# Patient Record
Sex: Female | Born: 1980 | Race: Black or African American | Hispanic: No | Marital: Single | Smoking: Former smoker
Health system: Southern US, Community
[De-identification: ages and names within clinical notes are randomized; demographics above are authoritative.]

## PROBLEM LIST (undated history)

## (undated) HISTORY — PX: WISDOM TOOTH EXTRACTION: SHX21

## (undated) HISTORY — PX: CYST EXCISION: SHX5701

## (undated) HISTORY — DX: Morbid (severe) obesity due to excess calories: E66.01

---

## 2005-10-08 ENCOUNTER — Emergency Department (HOSPITAL_COMMUNITY): Admission: EM | Admit: 2005-10-08 | Discharge: 2005-10-08 | Payer: Self-pay | Admitting: Emergency Medicine

## 2006-03-09 ENCOUNTER — Emergency Department (HOSPITAL_COMMUNITY): Admission: EM | Admit: 2006-03-09 | Discharge: 2006-03-09 | Payer: Self-pay | Admitting: Emergency Medicine

## 2007-05-05 ENCOUNTER — Emergency Department (HOSPITAL_COMMUNITY): Admission: EM | Admit: 2007-05-05 | Discharge: 2007-05-05 | Payer: Self-pay | Admitting: Emergency Medicine

## 2009-03-22 ENCOUNTER — Emergency Department (HOSPITAL_COMMUNITY): Admission: EM | Admit: 2009-03-22 | Discharge: 2009-03-23 | Payer: Self-pay | Admitting: Emergency Medicine

## 2009-04-13 ENCOUNTER — Emergency Department (HOSPITAL_COMMUNITY): Admission: EM | Admit: 2009-04-13 | Discharge: 2009-04-14 | Payer: Self-pay | Admitting: Emergency Medicine

## 2010-07-01 IMAGING — US US OB COMP LESS 14 WK
1 series · 14 of 22 positions shown · non-contrast
Comparison: None

CLINICAL DATA: None weeks gestation.  Vaginal bleeding.

OBSTETRIC <14 WK ULTRASOUND
TECHNIQUE: Transabdominal ultrasound was performed for evaluation
of the gestation as well as the maternal uterus and adnexal
regions.

[Series 1: us ob comp less 14 wk · 0.22mm/px · 14 of 22 slices shown]
[im 1/22]
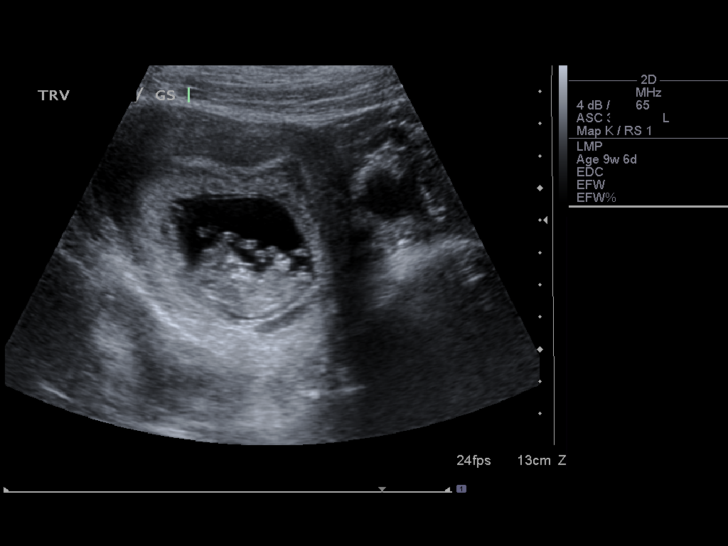
[im 3/22]
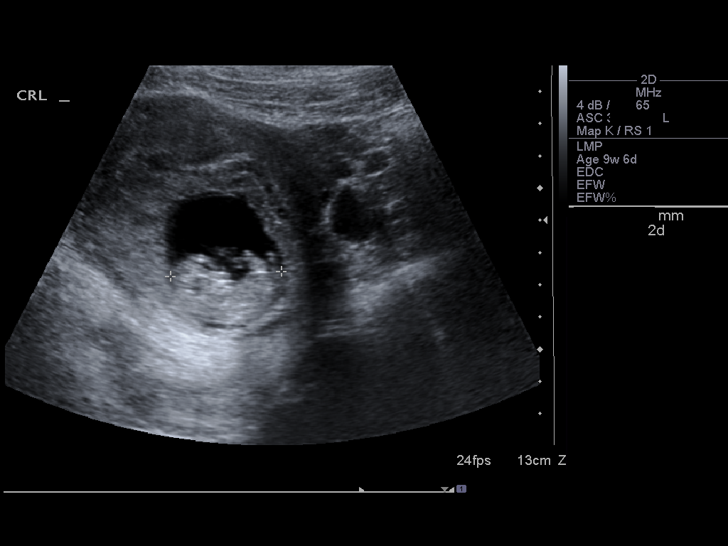
[im 4/22]
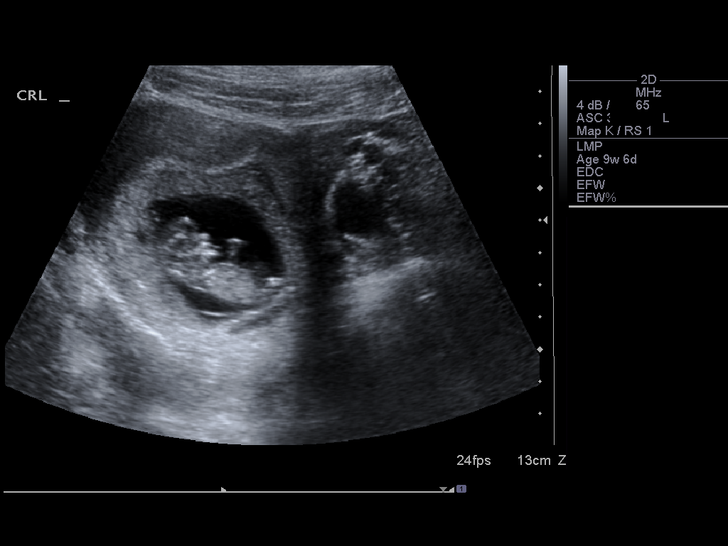
[im 6/22]
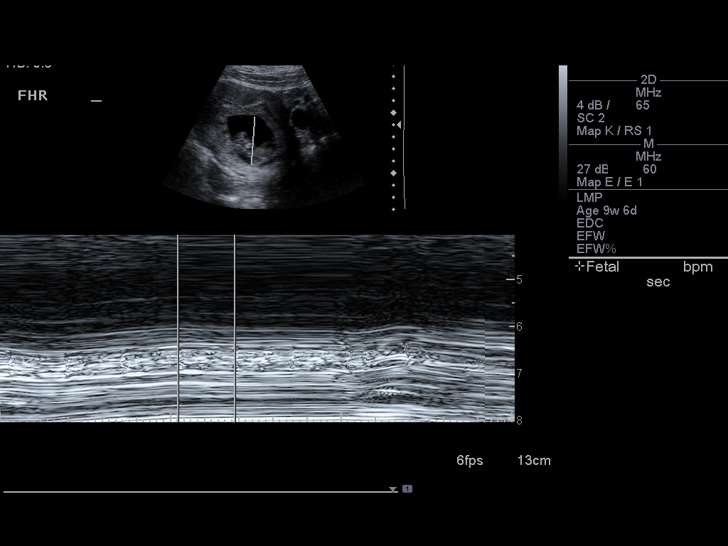
[im 8/22]
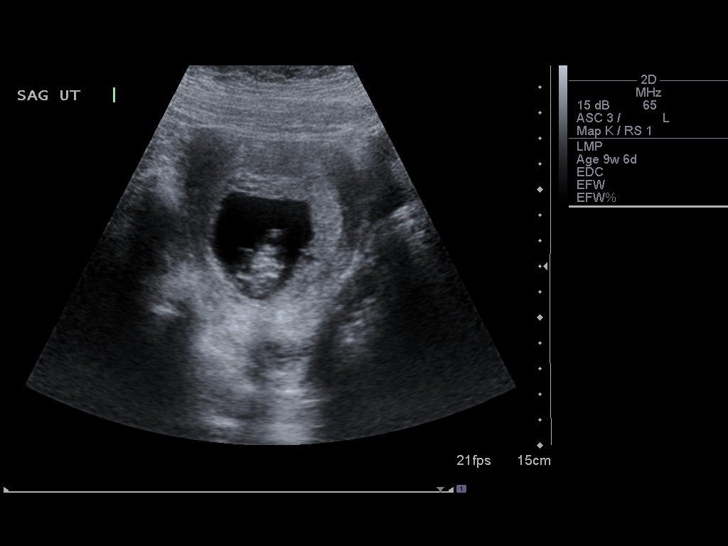
[im 9/22]
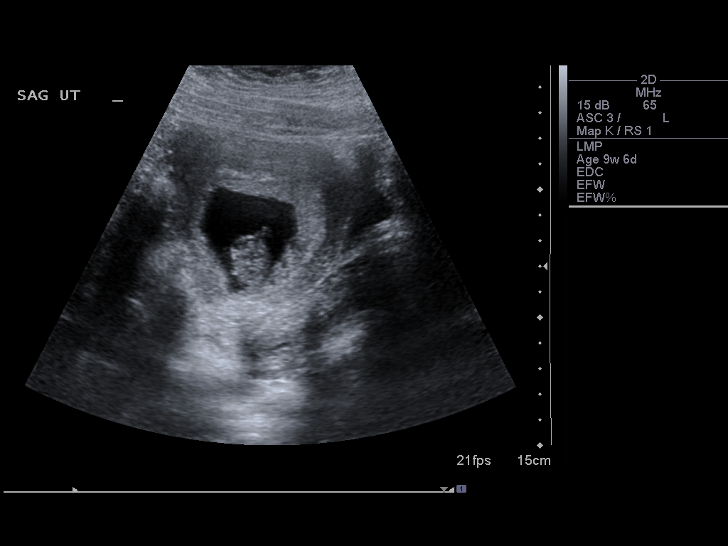
[im 11/22]
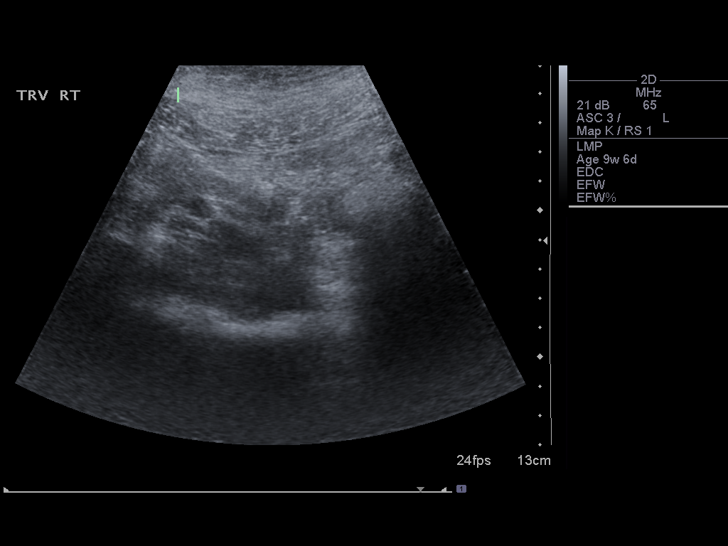
[im 12/22]
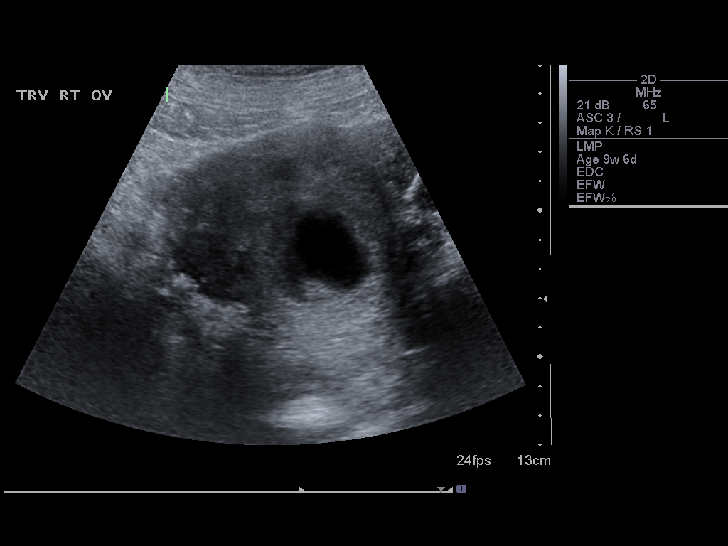
[im 14/22]
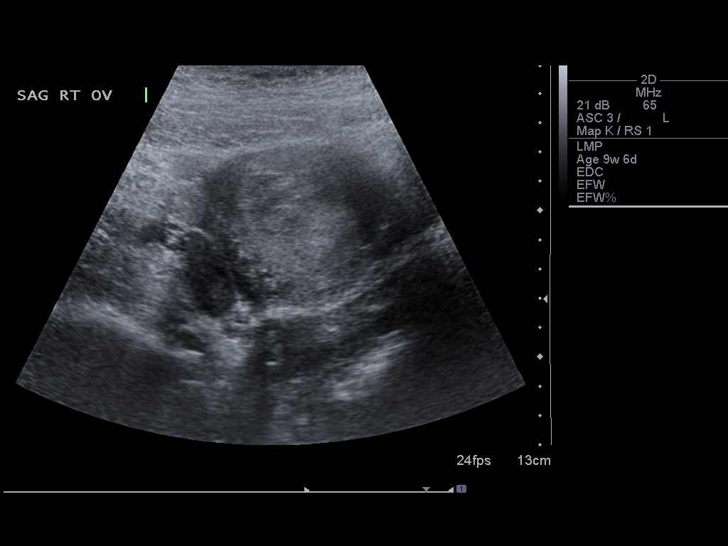
[im 15/22]
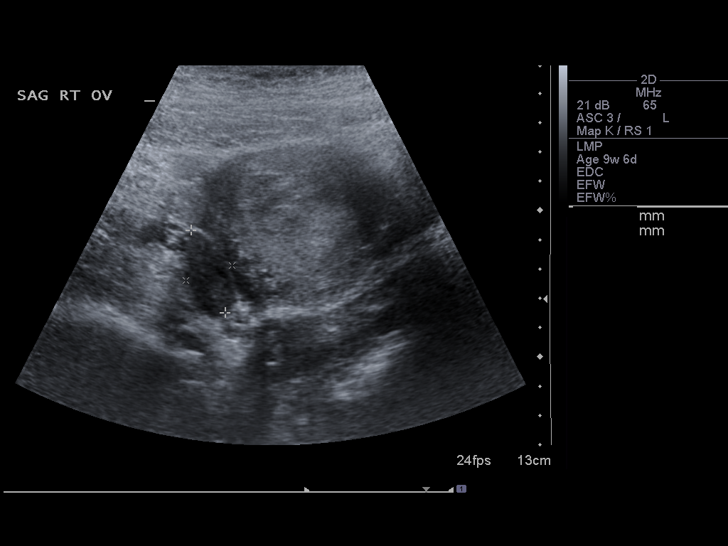
[im 17/22]
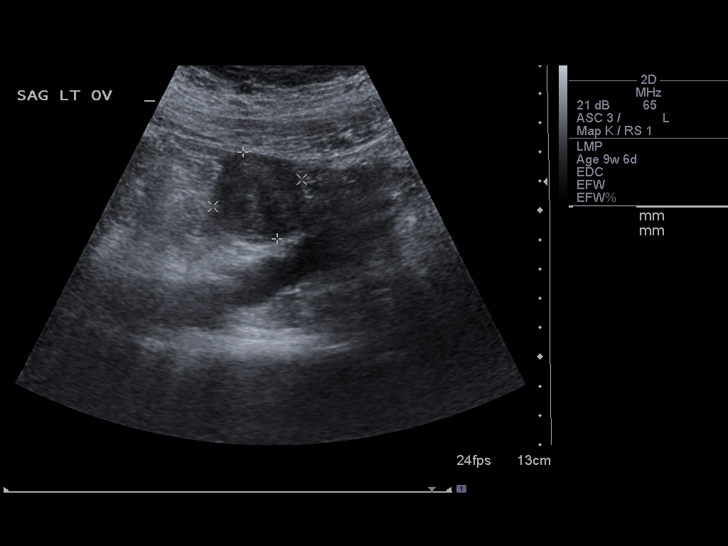
[im 19/22]
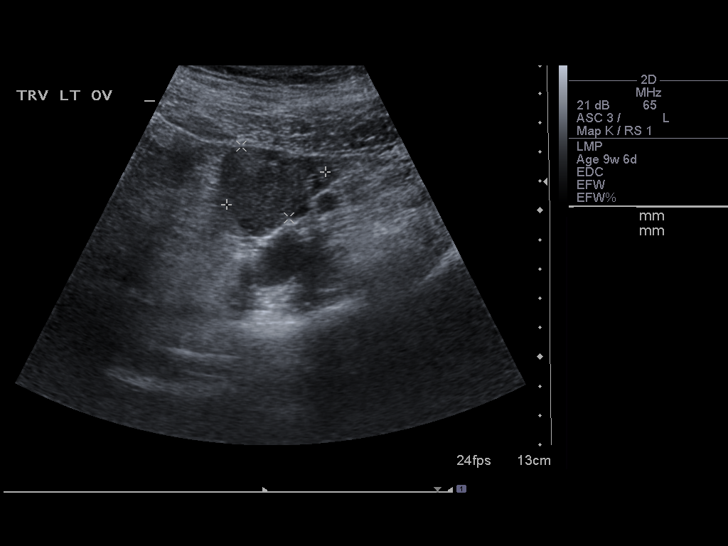
[im 20/22]
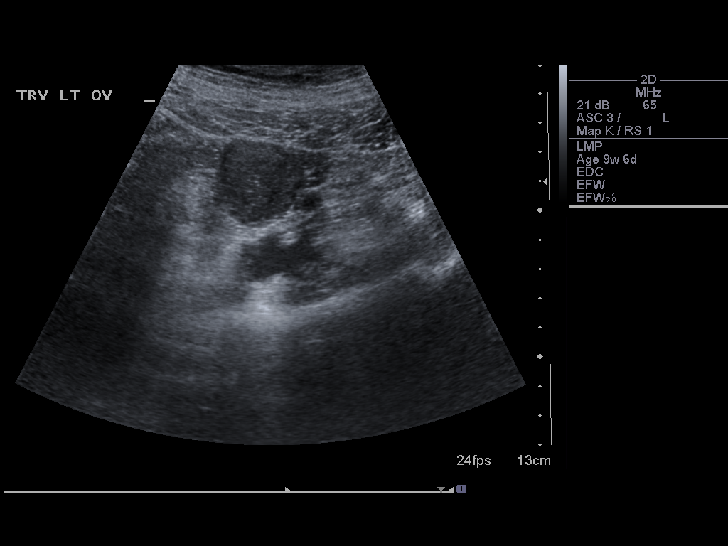
[im 22/22]
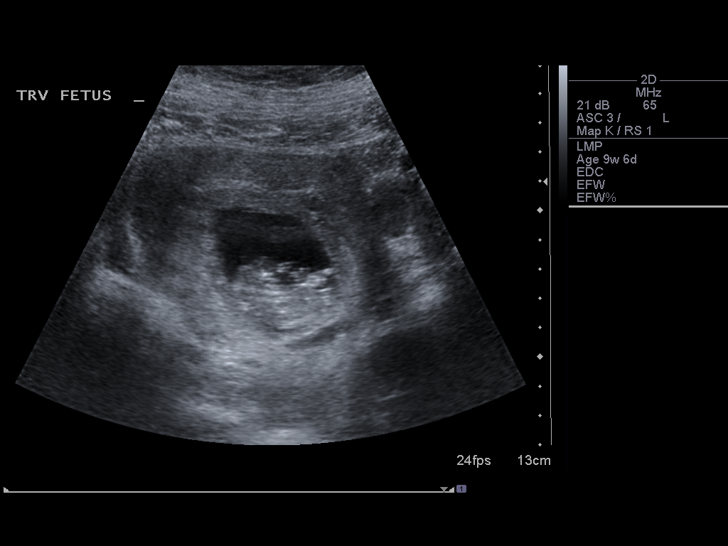

[14 of 22 positions shown; findings below may reference images not displayed]

Intrauterine gestational sac: Single
Yolk sac: Not visualized.
Embryo: Single live intrauterine gestation.
Cardiac Activity: Present
Heart Rate: 175 bpm

CRL:  35.5 mm         10 weeks 3 days           US EDC: 11/07/2009

Maternal uterus/Adnexae:
Incompletely fused amnion is noted.  There is no subchorionic
hematoma.  Both maternal ovaries are visualized and appear normal.
IMPRESSION: 1.  Single live intrauterine gestation with best estimated
gestational age of 10 weeks 3 days.
2.  No demonstrated complication.

## 2010-12-29 LAB — URINE MICROSCOPIC-ADD ON

## 2010-12-29 LAB — CBC
MCV: 90.2 fL (ref 78.0–100.0)
RBC: 4.07 MIL/uL (ref 3.87–5.11)
WBC: 13.4 10*3/uL — ABNORMAL HIGH (ref 4.0–10.5)

## 2010-12-29 LAB — DIFFERENTIAL
Lymphocytes Relative: 23 % (ref 12–46)
Lymphs Abs: 3 10*3/uL (ref 0.7–4.0)
Monocytes Relative: 7 % (ref 3–12)
Neutro Abs: 9.1 10*3/uL — ABNORMAL HIGH (ref 1.7–7.7)
Neutrophils Relative %: 68 % (ref 43–77)

## 2010-12-29 LAB — GC/CHLAMYDIA PROBE AMP, GENITAL
Chlamydia, DNA Probe: NEGATIVE
GC Probe Amp, Genital: NEGATIVE
GC Probe Amp, Genital: NEGATIVE

## 2010-12-29 LAB — URINALYSIS, ROUTINE W REFLEX MICROSCOPIC
Bilirubin Urine: NEGATIVE
Bilirubin Urine: NEGATIVE
Glucose, UA: NEGATIVE mg/dL
Glucose, UA: NEGATIVE mg/dL
Hgb urine dipstick: NEGATIVE
Protein, ur: NEGATIVE mg/dL
Specific Gravity, Urine: 1.029 (ref 1.005–1.030)
Urobilinogen, UA: 0.2 mg/dL (ref 0.0–1.0)
Urobilinogen, UA: 0.2 mg/dL (ref 0.0–1.0)

## 2010-12-29 LAB — POCT I-STAT, CHEM 8
BUN: 14 mg/dL (ref 6–23)
Chloride: 103 mEq/L (ref 96–112)
Creatinine, Ser: 0.8 mg/dL (ref 0.4–1.2)
Potassium: 4.2 mEq/L (ref 3.5–5.1)
Sodium: 137 mEq/L (ref 135–145)

## 2010-12-29 LAB — WET PREP, GENITAL
Clue Cells Wet Prep HPF POC: NONE SEEN
Trich, Wet Prep: NONE SEEN
Yeast Wet Prep HPF POC: NONE SEEN

## 2010-12-29 LAB — HCG, QUANTITATIVE, PREGNANCY
hCG, Beta Chain, Quant, S: 47608 m[IU]/mL — ABNORMAL HIGH (ref <5)
hCG, Beta Chain, Quant, S: 25910 m[IU]/mL — ABNORMAL HIGH (ref <5)

## 2010-12-29 LAB — ABO/RH: ABO/RH(D): O NEG

## 2010-12-29 LAB — POCT PREGNANCY, URINE: Preg Test, Ur: POSITIVE

## 2011-07-07 LAB — URINE MICROSCOPIC-ADD ON

## 2011-07-07 LAB — URINALYSIS, ROUTINE W REFLEX MICROSCOPIC
Hgb urine dipstick: NEGATIVE
Nitrite: NEGATIVE
Protein, ur: NEGATIVE
Specific Gravity, Urine: 1.023
Urobilinogen, UA: 0.2

## 2011-07-07 LAB — URINE CULTURE

## 2019-01-11 ENCOUNTER — Emergency Department (HOSPITAL_COMMUNITY)
Admission: EM | Admit: 2019-01-11 | Discharge: 2019-01-11 | Disposition: A | Discharge status: Home / Self Care | Payer: Medicaid - Out of State | Attending: Emergency Medicine | Admitting: Emergency Medicine

## 2019-01-11 ENCOUNTER — Other Ambulatory Visit: Payer: Self-pay

## 2019-01-11 ENCOUNTER — Encounter (HOSPITAL_COMMUNITY): Payer: Self-pay

## 2019-01-11 DIAGNOSIS — Z01419 Encounter for gynecological examination (general) (routine) without abnormal findings: Secondary | ICD-10-CM

## 2019-01-11 DIAGNOSIS — Y9389 Activity, other specified: Secondary | ICD-10-CM | POA: Insufficient documentation

## 2019-01-11 DIAGNOSIS — Y929 Unspecified place or not applicable: Secondary | ICD-10-CM | POA: Insufficient documentation

## 2019-01-11 DIAGNOSIS — T192XXA Foreign body in vulva and vagina, initial encounter: Secondary | ICD-10-CM | POA: Diagnosis present

## 2019-01-11 DIAGNOSIS — Z711 Person with feared health complaint in whom no diagnosis is made: Secondary | ICD-10-CM | POA: Insufficient documentation

## 2019-01-11 DIAGNOSIS — Y999 Unspecified external cause status: Secondary | ICD-10-CM | POA: Diagnosis not present

## 2019-01-11 DIAGNOSIS — X58XXXA Exposure to other specified factors, initial encounter: Secondary | ICD-10-CM | POA: Insufficient documentation

## 2019-01-11 NOTE — ED Provider Notes (Signed)
[facility] EMERGENCY DEPARTMENT Provider Note   CSN: [id number] Arrival date & time: 01/11/19  1929    History   Chief Complaint Chief Complaint  Patient presents with  . Foreign Body in Vagina    HPI Jacqueline Castillo is a 38 y.o. female.     Patient is a 38 year old female with no past medical history who presents the emergency department for complaints of thinking that she had a condom retained in her vagina.  Patient reports that she had sexual intercourse with her partner last night around 2 AM.  She reports that her partner thought that the condom fell off and was retained in her vagina.  The patient has no complaints at this time.  Denies any vaginal irritation, vaginal bleeding, vaginal discharge, vaginal odor, abdominal pain, dysuria.     History reviewed. No pertinent past medical history.  There are no active problems to display for this patient.   History reviewed. No pertinent surgical history.   OB History   No obstetric history on file.      Home Medications    Prior to Admission medications   Not on File    Family History No family history on file.  Social History Social History   Tobacco Use  . Smoking status: Not on file  Substance Use Topics  . Alcohol use: Not on file  . Drug use: Not on file     Allergies   Patient has no allergy information on record.   Review of Systems Review of Systems  Constitutional: Negative for chills and fever.  HENT: Negative for ear pain and sore throat.   Eyes: Negative for pain and visual disturbance.  Respiratory: Negative for cough and shortness of breath.   Cardiovascular: Negative for chest pain and palpitations.  Gastrointestinal: Negative for abdominal pain and vomiting.  Genitourinary: Negative.  Negative for dysuria and hematuria.  Musculoskeletal: Negative for arthralgias and back pain.  Skin: Negative for color change and rash.  Neurological: Negative for seizures and  syncope.  All other systems reviewed and are negative.    Physical Exam Updated Vital Signs BP 98/82 (BP Location: Right Arm)   Pulse 89   Temp 98.3 F (36.8 C) (Oral)   Resp 18   SpO2 99%   Physical Exam Vitals signs and nursing note reviewed.  Constitutional:      General: She is not in acute distress.    Appearance: She is well-developed.  HENT:     Head: Normocephalic and atraumatic.  Eyes:     Conjunctiva/sclera: Conjunctivae normal.  Neck:     Musculoskeletal: Neck supple.  Cardiovascular:     Rate and Rhythm: Normal rate and regular rhythm.     Heart sounds: No murmur.  Pulmonary:     Effort: Pulmonary effort is normal. No respiratory distress.     Breath sounds: Normal breath sounds.  Abdominal:     Palpations: Abdomen is soft.     Tenderness: There is no abdominal tenderness.  Genitourinary:    Comments: Normal vaginal exam.  There is no foreign body in the vagina. Skin:    General: Skin is warm and dry.  Neurological:     Mental Status: She is alert.      ED Treatments / Results  Labs (all labs ordered are listed, but only abnormal results are displayed) Labs Reviewed - No data to display  EKG None  Radiology No results found.  Procedures Procedures (including critical care time)  Medications  Ordered in ED Medications - No data to display   Initial Impression / Assessment and Plan / ED Course  I have reviewed the triage vital signs and the nursing notes.  Pertinent labs & imaging results that were available during my care of the patient were reviewed by me and considered in my medical decision making (see chart for details).  Clinical Course as of Jan 11 2051  Tue Jan 11, 2019  2051 Patient thought that she may have a retained condom in her vagina.  Vaginal exam was normal.  She declined any vaginal swabs or STD testing.   [KM]    Clinical Course User Index [KM] Arlyn Dunning, PA-C         Final Clinical Impressions(s) / ED  Diagnoses   Final diagnoses:  Normal vaginal exam    ED Discharge Orders    None       Jeral Pinch 01/11/19 2052    Little, Ambrose Finland, MD 01/16/19 1024

## 2019-01-11 NOTE — Discharge Instructions (Addendum)
Thank you for allowing me to care for you today. Please return to the emergency department if you have new or worsening symptoms. Take your medications as instructed.  ° °

## 2019-01-11 NOTE — ED Triage Notes (Signed)
Pt states she has a condom stuck in her vagina.

## 2019-12-20 ENCOUNTER — Other Ambulatory Visit: Payer: Self-pay

## 2019-12-20 ENCOUNTER — Encounter (HOSPITAL_COMMUNITY): Payer: Self-pay | Admitting: Emergency Medicine

## 2019-12-20 ENCOUNTER — Emergency Department (HOSPITAL_COMMUNITY)
Admission: EM | Admit: 2019-12-20 | Discharge: 2019-12-20 | Disposition: A | Discharge status: Home / Self Care | Payer: HRSA Program | Attending: Emergency Medicine | Admitting: Emergency Medicine

## 2019-12-20 DIAGNOSIS — R5383 Other fatigue: Secondary | ICD-10-CM | POA: Diagnosis not present

## 2019-12-20 DIAGNOSIS — U071 COVID-19: Secondary | ICD-10-CM | POA: Diagnosis not present

## 2019-12-20 DIAGNOSIS — Z20822 Contact with and (suspected) exposure to covid-19: Secondary | ICD-10-CM

## 2019-12-20 NOTE — Discharge Instructions (Addendum)
We have sent a send out PCR COVID test per your request. Please stay home and self isolate until you receive your results. Given you had a POSITIVE rapid test at your job today it is recommended that you self isolate for 10 days starting from today (cleared: 04/10). Even if your PCR test is negative you have a documented POSITIVE test at work and you will still need to self isolate per CDC guidelines.   Please continue monitoring your symptoms at home. You may take Tylenol as needed for fevers and body aches.   Please see attached resources on COVID 19.

## 2019-12-20 NOTE — ED Triage Notes (Signed)
Pt reports testing covid + today at work. 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 Denies any symptoms. Wants a recheck.

## 2019-12-20 NOTE — ED Provider Notes (Signed)
[facility] EMERGENCY DEPARTMENT Provider Note   CSN: [id number] Arrival date & time: 12/20/19  1752     History Chief Complaint  Patient presents with  . covid positive    Jacqueline Castillo is a 39 y.o. female who presents to the ED today after having a positive rapid Covid test done at work.  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  She states she was told she was positive today however wants a PCR test to confirm.  States that she started feeling ill today.  She states she feels fatigued. Denies fevers, chills, cough, SOB, diarrhea. Denies recent COVID 19 positive exposure.   The history is provided by the patient and medical records.       History reviewed. No pertinent past medical history.  There are no problems to display for this patient.   History reviewed. No pertinent surgical history.   OB History   No obstetric history on file.     No family history on file.  Social History   Tobacco Use  . Smoking status: Not on file  Substance Use Topics  . Alcohol use: Not on file  . Drug use: Not on file    Home Medications Prior to Admission medications   Not on File    Allergies    Patient has no allergy information on record.  Review of Systems   Review of Systems  Constitutional: Positive for fatigue. Negative for chills and fever.  Respiratory: Negative for cough.     Physical Exam Updated Vital Signs BP (!) 137/105   Pulse (!) 106   Temp 98.5 F (36.9 C) (Oral)   Resp 16   Ht 5\' 4"  (1.626 m)   Wt 108 kg   SpO2 100%   BMI 40.85 kg/m   Physical Exam Vitals and nursing note reviewed.  Constitutional:      Appearance: She is not ill-appearing.  HENT:     Head: Normocephalic and atraumatic.  Eyes:     Conjunctiva/sclera: Conjunctivae normal.  Cardiovascular:     Rate and Rhythm: Normal rate and regular rhythm.     Pulses: Normal pulses.  Pulmonary:     Effort: Pulmonary effort is normal.     Breath sounds:  Normal breath sounds. No wheezing, rhonchi or rales.  Skin:    General: Skin is warm and dry.     Coloration: Skin is not jaundiced.  Neurological:     Mental Status: She is alert.     ED Results / Procedures / Treatments   Labs (all labs ordered are listed, but only abnormal results are displayed) Labs Reviewed  SARS CORONAVIRUS 2 (TAT 6-24 HRS)    EKG None  Radiology No results found.  Procedures Procedures (including critical care time)  Medications Ordered in ED Medications - No data to display  ED Course  I have reviewed the triage vital signs and the nursing notes.  Pertinent labs & imaging results that were available during my care of the patient were reviewed by me and considered in my medical decision making (see chart for details).    MDM Rules/Calculators/A&P                      39 year old female requesting PCR Covid test today.  Had a rapid Covid test done at work.  She began feeling ill today.  On arrival to the ED patient is afebrile, tachycardic in the 110s, nontachypneic.  She appears to  be in no acute distress.  She wants a second opinion which is why she wants the PCR test.  I lengthy discussion with patient regarding the fact that if she had a rapid positive Covid results then we can treat this as positive.  She is still requesting PCR testing be done.  I have strongly encouraged patient to self isolate for the next 10 days with her positive result from work.  A work note has been obtained for her.  Advised to monitor symptoms at home.  I encouraged her that even if her PCR test for what ever reason is negative that she should still treated as a positive test from her rapid test at work.  Patient is in agreement with plan.  She will follow up with her PCP.   This note was prepared using Dragon voice recognition software and may include unintentional dictation errors due to the inherent limitations of voice recognition software.  Kentley Cedillo was  evaluated in Emergency Department on 12/20/2019 for the symptoms described in the history of present illness. She was evaluated in the context of the global COVID-19 pandemic, which necessitated consideration that the patient might be at risk for infection with the SARS-CoV-2 virus that causes COVID-19. Institutional protocols and algorithms that pertain to the evaluation of patients at risk for COVID-19 are in a state of rapid change based on information released by regulatory bodies including the CDC and federal and state organizations. These policies and algorithms were followed during the patient's care in the ED.  Final Clinical Impression(s) / ED Diagnoses Final diagnoses:  Person under investigation for COVID-19    Rx / DC Orders ED Discharge Orders    None       Discharge Instructions     We have sent a send out PCR COVID test per your request. Please stay home and self isolate until you receive your results. Given you had a POSITIVE rapid test at your job today it is recommended that you self isolate for 10 days starting from today (cleared: 04/10). Even if your PCR test is negative you have a documented POSITIVE test at work and you will still need to self isolate per CDC guidelines.   Please continue monitoring your symptoms at home. You may take Tylenol as needed for fevers and body aches.   Please see attached resources on COVID 19.        Eustaquio Maize, PA-C 12/20/19 2146    Noemi Chapel, MD 12/21/19 1201

## 2019-12-20 NOTE — ED Notes (Signed)
Pt verbalizes understanding of d/c instructions. Pt ambulatory at d/c with all belongings.   

## 2019-12-21 LAB — SARS CORONAVIRUS 2 (TAT 6-24 HRS): SARS Coronavirus 2: POSITIVE — AB

## 2019-12-22 ENCOUNTER — Encounter: Payer: Self-pay | Admitting: Physician Assistant

## 2019-12-22 ENCOUNTER — Telehealth: Payer: Self-pay | Admitting: Physician Assistant

## 2019-12-22 ENCOUNTER — Other Ambulatory Visit: Payer: Self-pay | Admitting: Physician Assistant

## 2019-12-22 ENCOUNTER — Ambulatory Visit (HOSPITAL_COMMUNITY)
Admission: RE | Admit: 2019-12-22 | Discharge: 2019-12-22 | Disposition: A | Discharge status: Home / Self Care | Payer: HRSA Program | Source: Ambulatory Visit | Attending: Pulmonary Disease | Admitting: Pulmonary Disease

## 2019-12-22 DIAGNOSIS — U071 COVID-19: Secondary | ICD-10-CM | POA: Diagnosis not present

## 2019-12-22 MED ORDER — FAMOTIDINE IN NACL 20-0.9 MG/50ML-% IV SOLN: 20.0000 mg | Freq: Once | INTRAVENOUS | Status: DC | PRN

## 2019-12-22 MED ORDER — SODIUM CHLORIDE 0.9 % IV SOLN
INTRAVENOUS | Status: DC | PRN
  Administered 2019-12-22: 250 mL via INTRAVENOUS

## 2019-12-22 MED ORDER — DIPHENHYDRAMINE HCL 50 MG/ML IJ SOLN: 50.0000 mg | Freq: Once | INTRAMUSCULAR | Status: DC | PRN

## 2019-12-22 MED ORDER — METHYLPREDNISOLONE SODIUM SUCC 125 MG IJ SOLR: 125.0000 mg | Freq: Once | INTRAMUSCULAR | Status: DC | PRN

## 2019-12-22 MED ORDER — ALBUTEROL SULFATE HFA 108 (90 BASE) MCG/ACT IN AERS: 2.0000 | INHALATION_SPRAY | Freq: Once | RESPIRATORY_TRACT | Status: DC | PRN

## 2019-12-22 MED ORDER — EPINEPHRINE 0.3 MG/0.3ML IJ SOAJ: 0.3000 mg | Freq: Once | INTRAMUSCULAR | Status: DC | PRN

## 2019-12-22 MED ORDER — SODIUM CHLORIDE 0.9 % IV SOLN
700.0000 mg | Freq: Once | INTRAVENOUS | Status: AC
  Administered 2019-12-22: 12:00:00 700 mg via INTRAVENOUS
  Filled 2019-12-22: qty 20

## 2019-12-22 NOTE — Discharge Instructions (Signed)

## 2019-12-22 NOTE — Progress Notes (Signed)
  Diagnosis: COVID-19  Physician:Dr. Wright   Procedure: Covid Infusion Clinic Med: bamlanivimab infusion - Provided patient with bamlanimivab fact sheet for patients, parents and caregivers prior to infusion.  Complications: No immediate complications noted.  Discharge: Discharged home   Jacqueline Isaacks  Castillo 12/22/2019  

## 2019-12-22 NOTE — Telephone Encounter (Signed)
Called to discuss with patient about Covid symptoms and the use of bamlanivimab or casirivimab/imdevimab, a monoclonal antibody infusion for those with mild to moderate Covid symptoms and at a high risk of hospitalization.  Pt is qualified for this infusion at the [facility] due to BMI>35   Message left to call back  Kathryn Thompson PA-C  [facility]    

## 2019-12-22 NOTE — Progress Notes (Signed)
  I connected by phone with Jacqueline Castillo on 12/22/2019 at 9:06 AM to discuss the potential use of an new treatment for mild to moderate COVID-19 viral infection in non-hospitalized patients.  This patient is a 39 y.o. female that meets the FDA criteria for Emergency Use Authorization of bamlanivimab or casirivimab\imdevimab.  Has a (+) direct SARS-CoV-2 viral test result  Has mild or moderate COVID-19   Is ? 41 years of age and weighs ? 40 kg  Is NOT hospitalized due to COVID-19  Is NOT requiring oxygen therapy or requiring an increase in baseline oxygen flow rate due to COVID-19  Is within 10 days of symptom onset  Has at least one of the high risk factor(s) for progression to severe COVID-19 and/or hospitalization as defined in EUA.  Specific high risk criteria : BMI >/= 35   I have spoken and communicated the following to the patient or parent/caregiver:  1. FDA has authorized the emergency use of bamlanivimab and casirivimab\imdevimab for the treatment of mild to moderate COVID-19 in adults and pediatric patients with positive results of direct SARS-CoV-2 viral testing who are 83 years of age and older weighing at least 40 kg, and who are at high risk for progressing to severe COVID-19 and/or hospitalization.  2. The significant known and potential risks and benefits of bamlanivimab and casirivimab\imdevimab, and the extent to which such potential risks and benefits are unknown.  3. Information on available alternative treatments and the risks and benefits of those alternatives, including clinical trials.  4. Patients treated with bamlanivimab and casirivimab\imdevimab should continue to self-isolate and use infection control measures (e.g., wear mask, isolate, social distance, avoid sharing personal items, clean and disinfect "high touch" surfaces, and frequent handwashing) according to CDC guidelines.   5. The patient or parent/caregiver has the option to accept or refuse  bamlanivimab or casirivimab\imdevimab .  After reviewing this information with the patient, The patient agreed to proceed with receiving the bamlanimivab infusion and will be provided a copy of the Fact sheet prior to receiving the infusion.   Sx onset 3/30. Set up for infusion today 4/1 @ 12:30pm. Directions given  Cline Crock 12/22/2019 9:06 AM

## 2019-12-22 NOTE — Progress Notes (Signed)
Called to discuss with patient about Covid symptoms and the use of bamlanivimab or casirivimab/imdevimab, a monoclonal antibody infusion for those with mild to moderate Covid symptoms and at a high risk of hospitalization.  Pt is qualified for this infusion at the [facility] due to BMI>35   Message left to call back  Jettie Mannor PA-C  [facility]    

## 2020-05-29 ENCOUNTER — Encounter: Payer: Self-pay | Admitting: Internal Medicine

## 2020-06-07 ENCOUNTER — Telehealth (INDEPENDENT_AMBULATORY_CARE_PROVIDER_SITE_OTHER): Payer: Self-pay | Admitting: Internal Medicine

## 2020-06-07 ENCOUNTER — Encounter: Payer: Self-pay | Admitting: Internal Medicine

## 2020-06-07 DIAGNOSIS — Z7689 Persons encountering health services in other specified circumstances: Secondary | ICD-10-CM

## 2020-06-07 NOTE — Progress Notes (Signed)
Virtual Visit via Telephone Note  I connected with Jacqueline Castillo, on 06/07/2020 at 2:03 PM by telephone due to the COVID-19 pandemic and verified that I am speaking with the correct person using two identifiers.   Consent: I discussed the limitations, risks, security and privacy concerns of performing an evaluation and management service by telephone and the availability of in person appointments. I also discussed with the patient that there may be a patient responsible charge related to this service. The patient expressed understanding and agreed to proceed.   Location of Patient: Home   Location of Provider: Clinic    Persons participating in Telemedicine visit: Ersa Delaney [facility] Dr. Earlene Plater    History of Present Illness: Patient has a visit to establish care. Needs a new PCP. Last visit was in 2019. Moved here from [place]--has been visiting and moving back and forth since 2005. She is a traveling CNA. No significant PMH. No Rxs. No surgical history. Already established with Ob-GYN in the area. UTD on PAP--just had one this month and was negative. Had her first mammogram this month and was normal. No concerns.    Past Medical History:  Diagnosis Date  . Morbid obesity (HCC)    No Known Allergies  No current outpatient medications on file prior to visit.   No current facility-administered medications on file prior to visit.    Observations/Objective: NAD. Speaking clearly.  Work of breathing normal.  Alert and oriented. Mood appropriate.   Assessment and Plan: 1. Encounter to establish care Reviewed patient's PMH, social history, surgical history, and medications.  Has received COVID vaccine.  Is overdue for annual exam, screening blood work, and health maintenance topics. Have asked patient to return for visit to address these items.    Follow Up Instructions: Annual exam    I discussed the assessment and treatment plan with the patient. The patient was  provided an opportunity to ask questions and all were answered. The patient agreed with the plan and demonstrated an understanding of the instructions.   The patient was advised to call back or seek an in-person evaluation if the symptoms worsen or if the condition fails to improve as anticipated.     I provided 10 minutes total of non-face-to-face time during this encounter including median intraservice time, reviewing previous notes, investigations, ordering medications, medical decision making, coordinating care and patient verbalized understanding at the end of the visit.    Marcy Siren, D.O. Primary Care at [facility]  06/07/2020, 2:03 PM

## 2020-06-29 NOTE — Patient Instructions (Signed)

## 2020-07-02 ENCOUNTER — Ambulatory Visit (INDEPENDENT_AMBULATORY_CARE_PROVIDER_SITE_OTHER): Payer: 59 | Admitting: Internal Medicine

## 2020-07-02 ENCOUNTER — Other Ambulatory Visit: Payer: Self-pay

## 2020-07-02 ENCOUNTER — Encounter: Payer: Self-pay | Admitting: Internal Medicine

## 2020-07-02 ENCOUNTER — Other Ambulatory Visit (HOSPITAL_COMMUNITY)
Admission: RE | Admit: 2020-07-02 | Discharge: 2020-07-02 | Disposition: A | Discharge status: Home / Self Care | Payer: 59 | Source: Ambulatory Visit | Attending: Internal Medicine | Admitting: Internal Medicine

## 2020-07-02 VITALS — BP 108/60 | HR 77 | Temp 97.2°F | Resp 17 | Ht 67.0 in | Wt 219.0 lb

## 2020-07-02 DIAGNOSIS — Z1159 Encounter for screening for other viral diseases: Secondary | ICD-10-CM

## 2020-07-02 DIAGNOSIS — Z0000 Encounter for general adult medical examination without abnormal findings: Secondary | ICD-10-CM

## 2020-07-02 DIAGNOSIS — Z23 Encounter for immunization: Secondary | ICD-10-CM

## 2020-07-02 DIAGNOSIS — Z113 Encounter for screening for infections with a predominantly sexual mode of transmission: Secondary | ICD-10-CM | POA: Insufficient documentation

## 2020-07-02 DIAGNOSIS — Z1231 Encounter for screening mammogram for malignant neoplasm of breast: Secondary | ICD-10-CM

## 2020-07-02 NOTE — Progress Notes (Signed)
Subjective:    Jacqueline Castillo - 39 y.o. female MRN [id number]  Date of birth: Jan 10, 1981  HPI  Jacqueline Castillo is here for annual exam. Has no concerns today. Needs paperwork filled out for job for insurance policy. [place] [employer] [place] She is very proud of herself for losing 81 lbs. Has struggled with weight loss but feels like she is on track currently.      Health Maintenance:  Health Maintenance Due  Topic Date Due   Hepatitis C Screening  Never done   HIV Screening  Never done   INFLUENZA VACCINE  04/22/2020    -  reports that she has quit smoking. Her smoking use included cigarettes. She has never used smokeless tobacco. - Review of Systems: Per HPI. - Past Medical History: Patient Active Problem List   Diagnosis Date Noted   Morbid obesity (HCC)    - Medications: reviewed and updated   Objective:   Physical Exam BP 108/60    Pulse 77    Temp (!) 97.2 F (36.2 C) (Temporal)    Resp 17    Ht 5\' 7"  (1.702 m)    Wt 219 lb (99.3 kg)    SpO2 97%    BMI 34.30 kg/m  Physical Exam Constitutional:      Appearance: She is not diaphoretic.  HENT:     Head: Normocephalic and atraumatic.  Eyes:     Conjunctiva/sclera: Conjunctivae normal.     Pupils: Pupils are equal, round, and reactive to light.  Neck:     Thyroid: No thyromegaly.  Cardiovascular:     Rate and Rhythm: Normal rate and regular rhythm.     Heart sounds: Normal heart sounds. No murmur heard.   Pulmonary:     Effort: Pulmonary effort is normal. No respiratory distress.     Breath sounds: Normal breath sounds. No wheezing.  Abdominal:     General: Bowel sounds are normal. There is no distension.     Palpations: Abdomen is soft.     Tenderness: There is no abdominal tenderness. There is no guarding or rebound.  Musculoskeletal:        General: No deformity. Normal range of motion.     Cervical back: Normal range of motion and neck supple.  Lymphadenopathy:     Cervical:  No cervical adenopathy.  Skin:    General: Skin is warm and dry.     Findings: No rash.  Neurological:     Mental Status: She is alert and oriented to person, place, and time.     Gait: Gait is intact.  Psychiatric:        Mood and Affect: Mood and affect normal.        Judgment: Judgment normal.            Assessment & Plan:   1. Annual physical exam Counseled on 150 minutes of exercise per week, healthy eating (including decreased daily intake of saturated fats, cholesterol, added sugars, sodium), STI prevention, routine healthcare maintenance. - CBC with Differential - Comprehensive metabolic panel - Lipid Panel  2. Screening for STDs (sexually transmitted diseases) - Cervicovaginal ancillary only - RPR - HIV antibody (with reflex)  3. Breast cancer screening by mammogram - MM Digital Screening; Future  4. Need for hepatitis C screening test - HCV Ab w/Rflx to Verification  5. Needs flu shot - Flu Vaccine QUAD 6+ mos PF IM (Fluarix Quad PF)   , D.O. 07/02/2020, 9:50 AM Primary  Care at [facility]

## 2020-07-03 LAB — CERVICOVAGINAL ANCILLARY ONLY
Bacterial Vaginitis (gardnerella): NEGATIVE
Candida Glabrata: NEGATIVE
Candida Vaginitis: NEGATIVE
Chlamydia: NEGATIVE
Comment: NEGATIVE
Comment: NEGATIVE
Comment: NEGATIVE
Comment: NEGATIVE
Comment: NEGATIVE
Comment: NORMAL
Neisseria Gonorrhea: NEGATIVE
Trichomonas: NEGATIVE

## 2020-07-03 LAB — HCV AB W/RFLX TO VERIFICATION: HCV Ab: <0.1 s/co ratio (ref 0.0–0.9)

## 2020-07-03 LAB — CBC WITH DIFFERENTIAL/PLATELET
Basophils Absolute: 0 10*3/uL (ref 0.0–0.2)
Basos: 1 %
EOS (ABSOLUTE): 0.2 10*3/uL (ref 0.0–0.4)
Eos: 2 %
Hematocrit: 40.3 % (ref 34.0–46.6)
Hemoglobin: 13.4 g/dL (ref 11.1–15.9)
Immature Grans (Abs): 0 10*3/uL (ref 0.0–0.1)
Immature Granulocytes: 0 %
Lymphocytes Absolute: 2.6 10*3/uL (ref 0.7–3.1)
Lymphs: 41 %
MCH: 29.9 pg (ref 26.6–33.0)
MCHC: 33.3 g/dL (ref 31.5–35.7)
MCV: 90 fL (ref 79–97)
Monocytes Absolute: 0.5 10*3/uL (ref 0.1–0.9)
Monocytes: 8 %
Neutrophils Absolute: 3 10*3/uL (ref 1.4–7.0)
Neutrophils: 48 %
Platelets: 336 10*3/uL (ref 150–450)
RBC: 4.48 x10E6/uL (ref 3.77–5.28)
RDW: 12.3 % (ref 11.7–15.4)
WBC: 6.3 10*3/uL (ref 3.4–10.8)

## 2020-07-03 LAB — LIPID PANEL
Chol/HDL Ratio: 3 ratio (ref 0.0–4.4)
Cholesterol, Total: 169 mg/dL (ref 100–199)
HDL: 57 mg/dL (ref >39)
LDL Chol Calc (NIH): 97 mg/dL (ref 0–99)
Triglycerides: 79 mg/dL (ref 0–149)
VLDL Cholesterol Cal: 15 mg/dL (ref 5–40)

## 2020-07-03 LAB — COMPREHENSIVE METABOLIC PANEL
ALT: 13 IU/L (ref 0–32)
AST: 17 IU/L (ref 0–40)
Albumin/Globulin Ratio: 1.4 (ref 1.2–2.2)
Albumin: 4.2 g/dL (ref 3.8–4.8)
Alkaline Phosphatase: 76 IU/L (ref 44–121)
BUN/Creatinine Ratio: 16 (ref 9–23)
BUN: 11 mg/dL (ref 6–24)
Bilirubin Total: 0.3 mg/dL (ref 0.0–1.2)
CO2: 26 mmol/L (ref 20–29)
Calcium: 9.1 mg/dL (ref 8.7–10.2)
Chloride: 100 mmol/L (ref 96–106)
Creatinine, Ser: 0.7 mg/dL (ref 0.57–1.00)
GFR calc Af Amer: 124 mL/min/{1.73_m2} (ref >59)
GFR calc non Af Amer: 108 mL/min/{1.73_m2} (ref >59)
Globulin, Total: 2.9 g/dL (ref 1.5–4.5)
Glucose: 96 mg/dL (ref 65–99)
Potassium: 4.3 mmol/L (ref 3.5–5.2)
Sodium: 135 mmol/L (ref 134–144)
Total Protein: 7.1 g/dL (ref 6.0–8.5)

## 2020-07-03 LAB — HIV ANTIBODY (ROUTINE TESTING W REFLEX): HIV Screen 4th Generation wRfx: NONREACTIVE

## 2020-07-03 LAB — RPR: RPR Ser Ql: NONREACTIVE

## 2020-07-03 LAB — HCV INTERPRETATION

## 2020-09-18 ENCOUNTER — Ambulatory Visit: Payer: 59

## 2020-09-25 ENCOUNTER — Ambulatory Visit (INDEPENDENT_AMBULATORY_CARE_PROVIDER_SITE_OTHER): Payer: 59

## 2020-09-25 ENCOUNTER — Other Ambulatory Visit: Payer: Self-pay

## 2020-09-25 DIAGNOSIS — Z111 Encounter for screening for respiratory tuberculosis: Secondary | ICD-10-CM

## 2020-09-25 NOTE — Progress Notes (Signed)
Tuberculin skin test applied to right ventral forearm. Explained to patient to return within 48-72 hours for reading and paperwork. Patient verbalized understanding.  

## 2020-09-27 ENCOUNTER — Other Ambulatory Visit: Payer: Self-pay

## 2020-09-27 ENCOUNTER — Ambulatory Visit: Payer: Medicaid Other

## 2020-09-27 DIAGNOSIS — Z111 Encounter for screening for respiratory tuberculosis: Secondary | ICD-10-CM

## 2020-09-27 LAB — TB SKIN TEST
Induration: 0 mm
TB Skin Test: NEGATIVE

## 2020-09-27 NOTE — Progress Notes (Signed)
PPD read today @ 1:35pm, result was negative, pt given letter with result.

## 2020-09-27 NOTE — Progress Notes (Signed)
PPD reading complete, results negative, pt given letter w/ results

## 2020-10-22 ENCOUNTER — Telehealth (INDEPENDENT_AMBULATORY_CARE_PROVIDER_SITE_OTHER): Payer: 59 | Admitting: Physician Assistant

## 2020-10-22 ENCOUNTER — Encounter: Payer: Self-pay | Admitting: Emergency Medicine

## 2020-10-22 ENCOUNTER — Other Ambulatory Visit: Payer: Self-pay

## 2020-10-22 ENCOUNTER — Ambulatory Visit
Admission: EM | Admit: 2020-10-22 | Discharge: 2020-10-22 | Disposition: A | Discharge status: Home / Self Care | Payer: 59 | Attending: Emergency Medicine | Admitting: Emergency Medicine

## 2020-10-22 ENCOUNTER — Emergency Department (HOSPITAL_COMMUNITY)
Admission: EM | Admit: 2020-10-22 | Discharge: 2020-10-22 | Disposition: A | Discharge status: Home / Self Care | Payer: 59 | Attending: Emergency Medicine | Admitting: Emergency Medicine

## 2020-10-22 DIAGNOSIS — J00 Acute nasopharyngitis [common cold]: Secondary | ICD-10-CM | POA: Diagnosis not present

## 2020-10-22 DIAGNOSIS — Z5321 Procedure and treatment not carried out due to patient leaving prior to being seen by health care provider: Secondary | ICD-10-CM | POA: Diagnosis not present

## 2020-10-22 DIAGNOSIS — Z20822 Contact with and (suspected) exposure to covid-19: Secondary | ICD-10-CM

## 2020-10-22 DIAGNOSIS — R0981 Nasal congestion: Secondary | ICD-10-CM | POA: Insufficient documentation

## 2020-10-22 NOTE — Progress Notes (Signed)
Established Patient Office Visit  Subjective:  Patient ID: Jacqueline Castillo, female    DOB: Aug 24, 1981  Age: 40 y.o. MRN: [id number]  CC:  Chief Complaint  Patient presents with  . URI   Virtual Visit via Telephone Note  I connected with Jacqueline Castillo on 10/22/20 at  3:30 PM EST by telephone and verified that I am speaking with the correct person using two identifiers.  Location: Patient: Home Provider: Primary Care at [facility]   I discussed the limitations, risks, security and privacy concerns of performing an evaluation and management service by telephone and the availability of in person appointments. I also discussed with the patient that there may be a patient responsible charge related to this service. The patient expressed understanding and agreed to proceed.   History of Present Illness: Reports that she started experiencing a runny nose with clear drainage and a headache and chills yesterday.  Reports that she went to the emergency department at [facility] today, but left before being seen.  Reports that they did check her vitals, she did not have a fever.  Reports that she was tested for Covid but may not have the results for up to 4 days.  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  Has not tried anything for relief.   Reports two Moderna COVID vaccines with the last one in June 2021    Observations/Objective:  Medical history and current medications reviewed, no physical exam completed   Past Medical History:  Diagnosis Date  . Morbid obesity (HCC)     Past Surgical History:  Procedure Laterality Date  . CYST EXCISION     tailbone  . WISDOM TOOTH EXTRACTION      Family History  Problem Relation Age of Onset  . Huntington's disease Father     Social History   Socioeconomic History  . Marital status: Single    Spouse name: Not on file  . Number of children: Not on file  . Years of  education: Not on file  . Highest education level: Not on file  Occupational History  . Not on file  Tobacco Use  . Smoking status: Former Smoker    Types: Cigarettes  . Smokeless tobacco: Never Used  Substance and Sexual Activity  . Alcohol use: Not on file  . Drug use: Not on file  . Sexual activity: Not on file  Other Topics Concern  . Not on file  Social History Narrative  . Not on file   Social Determinants of Health   Financial Resource Strain: Not on file  Food Insecurity: Not on file  Transportation Needs: Not on file  Physical Activity: Not on file  Stress: Not on file  Social Connections: Not on file  Intimate Partner Violence: Not on file    No outpatient medications prior to visit.   No facility-administered medications prior to visit.    No Known Allergies  ROS Review of Systems  Constitutional: Positive for chills. Negative for fever.  HENT: Positive for rhinorrhea. Negative for congestion, sore throat and trouble swallowing.   Eyes: Negative.   Respiratory: Negative for cough and shortness of breath.   Cardiovascular: Negative.   Gastrointestinal: Negative for diarrhea, nausea and vomiting.  Endocrine: Negative.   Genitourinary: Negative.   Musculoskeletal: Negative for myalgias.  Skin: Negative.   Allergic/Immunologic: Negative.   Neurological: Positive for headaches.  Hematological: Negative.   Psychiatric/Behavioral: Negative.  Objective:     There were no vitals taken for this visit. Wt Readings from Last 3 Encounters:  07/02/20 219 lb (99.3 kg)  12/20/19 238 lb (108 kg)     Health Maintenance Due  Topic Date Due  . COVID-19 Vaccine (3 - Booster for Moderna series) 09/11/2020  . TETANUS/TDAP  09/22/2020    There are no preventive care reminders to display for this patient.  No results found for: TSH Lab Results  Component Value Date   WBC 6.3 07/02/2020   HGB 13.4 07/02/2020   HCT 40.3 07/02/2020   MCV 90 07/02/2020    PLT 336 07/02/2020   Lab Results  Component Value Date   NA 135 07/02/2020   K 4.3 07/02/2020   CO2 26 07/02/2020   GLUCOSE 96 07/02/2020   BUN 11 07/02/2020   CREATININE 0.70 07/02/2020   BILITOT 0.3 07/02/2020   ALKPHOS 76 07/02/2020   AST 17 07/02/2020   ALT 13 07/02/2020   PROT 7.1 07/02/2020   ALBUMIN 4.2 07/02/2020   CALCIUM 9.1 07/02/2020   Lab Results  Component Value Date   CHOL 169 07/02/2020   Lab Results  Component Value Date   HDL 57 07/02/2020   Lab Results  Component Value Date   LDLCALC 97 07/02/2020   Lab Results  Component Value Date   TRIG 79 07/02/2020   Lab Results  Component Value Date   CHOLHDL 3.0 07/02/2020   No results found for: HGBA1C    Assessment & Plan:   Problem List Items Addressed This Visit   None   Visit Diagnoses    Acute rhinitis    -  Primary     1. Acute rhinitis Patient was offered opportunity to present to Primary Care at [facility] in the morning for rapid Covid test.  Patient encouraged to use over-the-counter symptomatic treatment, follow her work protocol if she chooses not to have rapid testing completed.  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  Red flags given for prompt reevaluation  No orders of the defined types were placed in this encounter.   I have reviewed the patient's medical history (PMH, PSH, Social History, Family History, Medications, and allergies) , and have been updated if relevant. I spent 21 minutes reviewing chart and  Non face to face time with patient.     Follow-up: Return if symptoms worsen or fail to improve.    Kasandra Knudsen Mayers, PA-C

## 2020-10-22 NOTE — ED Notes (Signed)
Pt not answering

## 2020-10-22 NOTE — ED Notes (Signed)
Called patient to move to room, unable to locate at this time. 

## 2020-10-22 NOTE — ED Triage Notes (Signed)
Pt from home for eval of nasal congestion for a few days.

## 2020-10-22 NOTE — Progress Notes (Unsigned)
Patient verified DOB Patient complains of runny nose with clear drainage. Patient denies sore throat. Patient denies HA, patient complains of body aches. Patient N/V/ diarrhea. Patient denies fevers at home. Patient reports no exposure. Patients taste and smell is present. Patient has a normal appetite. Patient has not taken medication today.

## 2020-10-22 NOTE — ED Triage Notes (Signed)
Pt here for covid testing for work; denies sx

## 2020-10-23 LAB — NOVEL CORONAVIRUS, NAA: SARS-CoV-2, NAA: NOT DETECTED

## 2020-10-23 LAB — SARS-COV-2, NAA 2 DAY TAT

## 2020-10-24 ENCOUNTER — Encounter: Payer: Self-pay | Admitting: Physician Assistant

## 2020-10-24 NOTE — Patient Instructions (Signed)
Upper Respiratory Infection, Adult An upper respiratory infection (URI) is a common viral infection of the nose, throat, and upper air passages that lead to the lungs. The most common type of URI is the common cold. URIs usually get better on their own, without medical treatment. What are the causes? A URI is caused by a virus. You may catch a virus by:  Breathing in droplets from an infected person's cough or sneeze.  Touching something that has been exposed to the virus (contaminated) and then touching your mouth, nose, or eyes. What increases the risk? You are more likely to get a URI if:  You are very young or very old.  It is autumn or winter.  You have close contact with others, such as at a daycare, school, or health care facility.  You smoke.  You have long-term (chronic) heart or lung disease.  You have a weakened disease-fighting (immune) system.  You have nasal allergies or asthma.  You are experiencing a lot of stress.  You work in an area that has poor air circulation.  You have poor nutrition. What are the signs or symptoms? A URI usually involves some of the following symptoms:  Runny or stuffy (congested) nose.  Sneezing.  Cough.  Sore throat.  Headache.  Fatigue.  Fever.  Loss of appetite.  Pain in your forehead, behind your eyes, and over your cheekbones (sinus pain).  Muscle aches.  Redness or irritation of the eyes.  Pressure in the ears or face. How is this diagnosed? This condition may be diagnosed based on your medical history and symptoms, and a physical exam. Your health care provider may use a cotton swab to take a mucus sample from your nose (nasal swab). This sample can be tested to determine what virus is causing the illness. How is this treated? URIs usually get better on their own within 7-10 days. You can take steps at home to relieve your symptoms. Medicines cannot cure URIs, but your health care provider may recommend  certain medicines to help relieve symptoms, such as:  Over-the-counter cold medicines.  Cough suppressants. Coughing is a type of defense against infection that helps to clear the respiratory system, so take these medicines only as recommended by your health care provider.  Fever-reducing medicines. Follow these instructions at home: Activity  Rest as needed.  If you have a fever, stay home from work or school until your fever is gone or until your health care provider says you are no longer contagious. Your health care provider may have you wear a face mask to prevent your infection from spreading. Relieving symptoms  Gargle with a salt-water mixture 3-4 times a day or as needed. To make a salt-water mixture, completely dissolve -1 tsp of salt in 1 cup of warm water.  Use a cool-mist humidifier to add moisture to the air. This can help you breathe more easily. Eating and drinking  Drink enough fluid to keep your urine pale yellow.  Eat soups and other clear broths.   General instructions  Take over-the-counter and prescription medicines only as told by your health care provider. These include cold medicines, fever reducers, and cough suppressants.  Do not use any products that contain nicotine or tobacco, such as cigarettes and e-cigarettes. If you need help quitting, ask your health care provider.  Stay away from secondhand smoke.  Stay up to date on all immunizations, including the yearly (annual) flu vaccine.  Keep all follow-up visits as told by your health   care provider. This is important.   How to prevent the spread of infection to others  URIs can be passed from person to person (are contagious). To prevent the infection from spreading: ? Wash your hands often with soap and water. If soap and water are not available, use hand sanitizer. ? Avoid touching your mouth, face, eyes, or nose. ? Cough or sneeze into a tissue or your sleeve or elbow instead of into your hand or  into the air.   Contact a health care provider if:  You are getting worse instead of better.  You have a fever or chills.  Your mucus is brown or red.  You have yellow or brown discharge coming from your nose.  You have pain in your face, especially when you bend forward.  You have swollen neck glands.  You have pain while swallowing.  You have white areas in the back of your throat. Get help right away if:  You have shortness of breath that gets worse.  You have severe or persistent: ? Headache. ? Ear pain. ? Sinus pain. ? Chest pain.  You have chronic lung disease along with any of the following: ? Wheezing. ? Prolonged cough. ? Coughing up blood. ? A change in your usual mucus.  You have a stiff neck.  You have changes in your: ? Vision. ? Hearing. ? Thinking. ? Mood. Summary  An upper respiratory infection (URI) is a common infection of the nose, throat, and upper air passages that lead to the lungs.  A URI is caused by a virus.  URIs usually get better on their own within 7-10 days.  Medicines cannot cure URIs, but your health care provider may recommend certain medicines to help relieve symptoms. This information is not intended to replace advice given to you by your health care provider. Make sure you discuss any questions you have with your health care provider. Document Revised: 05/17/2020 Document Reviewed: 05/17/2020 Elsevier Patient Education  2021 Elsevier Inc.  

## 2021-01-16 ENCOUNTER — Other Ambulatory Visit: Payer: Self-pay | Admitting: Internal Medicine

## 2021-01-16 DIAGNOSIS — Z1231 Encounter for screening mammogram for malignant neoplasm of breast: Secondary | ICD-10-CM

## 2021-02-08 ENCOUNTER — Emergency Department (HOSPITAL_COMMUNITY)
Admission: EM | Admit: 2021-02-08 | Discharge: 2021-02-08 | Disposition: A | Discharge status: Home / Self Care | Payer: Worker's Compensation | Attending: Emergency Medicine | Admitting: Emergency Medicine

## 2021-02-08 ENCOUNTER — Encounter (HOSPITAL_COMMUNITY): Payer: Self-pay

## 2021-02-08 ENCOUNTER — Other Ambulatory Visit: Payer: Self-pay

## 2021-02-08 DIAGNOSIS — Y990 Civilian activity done for income or pay: Secondary | ICD-10-CM | POA: Insufficient documentation

## 2021-02-08 DIAGNOSIS — S0990XA Unspecified injury of head, initial encounter: Secondary | ICD-10-CM | POA: Diagnosis present

## 2021-02-08 DIAGNOSIS — Z87891 Personal history of nicotine dependence: Secondary | ICD-10-CM | POA: Diagnosis not present

## 2021-02-08 DIAGNOSIS — W228XXA Striking against or struck by other objects, initial encounter: Secondary | ICD-10-CM | POA: Diagnosis not present

## 2021-02-08 NOTE — ED Triage Notes (Signed)
Patient states she did not see a patient's end table when she bent over to give a patient her food tray because the table was covered by the curtain. Patient states she hit the left side of her head. No bleeding noted.

## 2021-02-08 NOTE — ED Provider Notes (Signed)
[facility]-EMERGENCY DEPT Provider Note   CSN: [id number] Arrival date & time: 02/08/21  1250     History Chief Complaint  Patient presents with  . Head Injury    Jacqueline Castillo is a 40 y.o. female who presents for evaluation of head injury that occurred at 8 AM this morning.  Patient reports she was at work and states that she did not see a patients and table.  She bent over to get something and states that she hit her head on the table.  No LOC.  She was able to work but she was continued to have headache.  She states that she is on blood thinners.  She has been ambulatory since this happened.  Denies any nausea/vomiting.  She took Tylenol for her headache with some improvement but states that it was still there.  She wanted to get checked out to make sure there is nothing else going on.  She denies any vision changes, neck pain, numbness/weakness of arms or legs, difficulty walking.  The history is provided by the patient.       Past Medical History:  Diagnosis Date  . Morbid obesity [facility])     Patient Active Problem List   Diagnosis Date Noted  . Morbid obesity (HCC)     Past Surgical History:  Procedure Laterality Date  . CYST EXCISION     tailbone  . WISDOM TOOTH EXTRACTION       OB History   No obstetric history on file.     Family History  Problem Relation Age of Onset  . Huntington's disease Father     Social History   Tobacco Use  . Smoking status: Former Smoker    Types: Cigarettes  . Smokeless tobacco: Never Used  Vaping Use  . Vaping Use: Never used  Substance Use Topics  . Alcohol use: Never  . Drug use: Never    Home Medications Prior to Admission medications   Not on File    Allergies    Patient has no known allergies.  Review of Systems   Review of Systems  Eyes: Negative for visual disturbance.  Gastrointestinal: Negative for nausea and vomiting.  Musculoskeletal: Negative for gait problem.  Neurological:  Positive for headaches. Negative for weakness and numbness.  All other systems reviewed and are negative.   Physical Exam Updated Vital Signs BP 138/88   Pulse 88   Temp 98.2 F (36.8 C) (Oral)   Resp 18   Ht 5\' 4"  (1.626 m)   Wt 90.7 kg   LMP 02/03/2021   SpO2 100%   BMI 34.33 kg/m   Physical Exam Vitals and nursing note reviewed.  Constitutional:      Appearance: She is well-developed.  HENT:     Head: Normocephalic and atraumatic.     Comments: No tenderness to palpation of skull. No deformities or crepitus noted. No open wounds, abrasions or lacerations.  Eyes:     General: No scleral icterus.       Right eye: No discharge.        Left eye: No discharge.     Conjunctiva/sclera: Conjunctivae normal.     Comments: PERRL. EOMs intact. No nystagmus. No neglect.  No tenderness palpation noted bilateral periorbital region.  No deformity or crepitus noted.  Neck:     Comments: Full flexion/extension and lateral movement of neck fully intact. No bony midline tenderness. No deformities or crepitus.  Pulmonary:     Effort: Pulmonary effort is  normal.  Skin:    General: Skin is warm and dry.  Neurological:     Mental Status: She is alert.     Comments: Cranial nerves III-XII intact Follows commands, Moves all extremities  5/5 strength to BUE and BLE  Sensation intact throughout all major nerve distributions Normal finger to nose. No pronator drift. No gait abnormalities  No slurred speech. No facial droop.   Psychiatric:        Speech: Speech normal.        Behavior: Behavior normal.     ED Results / Procedures / Treatments   Labs (all labs ordered are listed, but only abnormal results are displayed) Labs Reviewed - No data to display  EKG None  Radiology No results found.  Procedures Procedures   Medications Ordered in ED Medications - No data to display  ED Course  I have reviewed the triage vital signs and the nursing notes.  Pertinent labs &  imaging results that were available during my care of the patient were reviewed by me and considered in my medical decision making (see chart for details).    MDM Rules/Calculators/A&P                          40 year old female who presents for evaluation of head injury that occurred at 8 AM this morning.  Patient reports that she was bending down and hit her head on an end table.  No LOC.  Now reports pain to the left side of her head.  No vision changes, blood thinner use, nausea/vomiting, numbness/weakness of arms or legs.  On initial ED arrival, she is afebrile, nontoxic-appearing.  Vital signs are stable.  On exam, she has no tenderness palpation.  No definite neurodeficits.  No tenderness in the periorbital region.  Per patient is reassuring exam, no neurodeficits on exam, do not feel that this warrants a CT head. Given reassuring physical exam and per [facility] CT criteria, no imaging is indicated at this time.  I discussed this with patient and she is agreeable to plan.  We did discuss possible concussion after a minor head injury and I encouraged her on at home supportive care measures. At this time, patient exhibits no emergent life-threatening condition that require further evaluation in ED. Patient had ample opportunity for questions and discussion. All patient's questions were answered with full understanding. Strict return precautions discussed. Patient expresses understanding and agreement to plan.   Portions of this note were generated with Scientist, clinical (histocompatibility and immunogenetics). Dictation errors may occur despite best attempts at proofreading.   Final Clinical Impression(s) / ED Diagnoses Final diagnoses:  Minor head injury, initial encounter    Rx / DC Orders ED Discharge Orders    None       Rosana Hoes 02/08/21 1659    Charlynne Pander, MD 02/08/21 [phone]

## 2021-02-08 NOTE — Discharge Instructions (Signed)
After any head injury, no possible concussion.  Sometimes this includes headache, difficulty concentrating, fatigue.  As we discussed, engage in brain rest which includes limiting the amount of screen time, phone, TV, computer.  Additionally, he should not have any strenuous physical activity for 2 weeks.  Please follow-up with your primary care doctor.  Return to emergency department immediately if you have any difficulty walking, numbness/weakness of your arms or legs, vomiting, difficulty seeing, confusion or any other worsening or concerning symptoms.

## 2021-07-15 ENCOUNTER — Ambulatory Visit: Payer: 59

## 2021-07-16 ENCOUNTER — Ambulatory Visit (INDEPENDENT_AMBULATORY_CARE_PROVIDER_SITE_OTHER): Payer: 59

## 2021-07-16 ENCOUNTER — Other Ambulatory Visit: Payer: Self-pay

## 2021-07-16 DIAGNOSIS — Z23 Encounter for immunization: Secondary | ICD-10-CM

## 2021-07-16 NOTE — Progress Notes (Signed)
Patient given vaccines and tolerated well

## 2021-07-18 ENCOUNTER — Ambulatory Visit: Payer: 59

## 2021-08-01 ENCOUNTER — Other Ambulatory Visit: Payer: Self-pay

## 2021-08-01 ENCOUNTER — Ambulatory Visit (INDEPENDENT_AMBULATORY_CARE_PROVIDER_SITE_OTHER): Payer: 59 | Admitting: Family Medicine

## 2021-08-01 ENCOUNTER — Encounter: Payer: Self-pay | Admitting: Family Medicine

## 2021-08-01 VITALS — BP 114/79 | HR 79 | Temp 98.2°F | Resp 16 | Wt 199.4 lb

## 2021-08-01 DIAGNOSIS — Z0000 Encounter for general adult medical examination without abnormal findings: Secondary | ICD-10-CM | POA: Diagnosis not present

## 2021-08-02 ENCOUNTER — Encounter: Payer: Self-pay | Admitting: Family Medicine

## 2021-08-02 LAB — CMP14+EGFR
ALT: 10 IU/L (ref 0–32)
AST: 16 IU/L (ref 0–40)
Albumin/Globulin Ratio: 1.6 (ref 1.2–2.2)
Albumin: 3.9 g/dL (ref 3.8–4.8)
Alkaline Phosphatase: 73 IU/L (ref 44–121)
BUN/Creatinine Ratio: 15 (ref 9–23)
BUN: 12 mg/dL (ref 6–24)
Bilirubin Total: <0.2 mg/dL (ref 0.0–1.2)
CO2: 26 mmol/L (ref 20–29)
Calcium: 8.9 mg/dL (ref 8.7–10.2)
Chloride: 104 mmol/L (ref 96–106)
Creatinine, Ser: 0.79 mg/dL (ref 0.57–1.00)
Globulin, Total: 2.4 g/dL (ref 1.5–4.5)
Glucose: 88 mg/dL (ref 70–99)
Potassium: 4.9 mmol/L (ref 3.5–5.2)
Sodium: 138 mmol/L (ref 134–144)
Total Protein: 6.3 g/dL (ref 6.0–8.5)
eGFR: 96 mL/min/{1.73_m2} (ref >59)

## 2021-08-02 LAB — CBC WITH DIFFERENTIAL/PLATELET
Basophils Absolute: 0.1 10*3/uL (ref 0.0–0.2)
Basos: 1 %
EOS (ABSOLUTE): 0.1 10*3/uL (ref 0.0–0.4)
Eos: 3 %
Hematocrit: 40.3 % (ref 34.0–46.6)
Hemoglobin: 13 g/dL (ref 11.1–15.9)
Immature Grans (Abs): 0 10*3/uL (ref 0.0–0.1)
Immature Granulocytes: 0 %
Lymphocytes Absolute: 2.4 10*3/uL (ref 0.7–3.1)
Lymphs: 42 %
MCH: 29 pg (ref 26.6–33.0)
MCHC: 32.3 g/dL (ref 31.5–35.7)
MCV: 90 fL (ref 79–97)
Monocytes Absolute: 0.5 10*3/uL (ref 0.1–0.9)
Monocytes: 10 %
Neutrophils Absolute: 2.5 10*3/uL (ref 1.4–7.0)
Neutrophils: 44 %
Platelets: 335 10*3/uL (ref 150–450)
RBC: 4.48 x10E6/uL (ref 3.77–5.28)
RDW: 12 % (ref 11.7–15.4)
WBC: 5.6 10*3/uL (ref 3.4–10.8)

## 2021-08-02 LAB — LIPID PANEL
Chol/HDL Ratio: 2.8 ratio (ref 0.0–4.4)
Cholesterol, Total: 167 mg/dL (ref 100–199)
HDL: 60 mg/dL (ref >39)
LDL Chol Calc (NIH): 95 mg/dL (ref 0–99)
Triglycerides: 62 mg/dL (ref 0–149)
VLDL Cholesterol Cal: 12 mg/dL (ref 5–40)

## 2021-08-02 NOTE — Progress Notes (Signed)
Established Patient Office Visit  Subjective:  Patient ID: Jacqueline Castillo, female    DOB: 1981/03/13  Age: 40 y.o. MRN: [id number]  CC:  Chief Complaint  Patient presents with   Annual Exam    HPI Jacqueline Castillo presents for routine annual physical exam. Patient denies acute complaints or concerns.   Past Medical History:  Diagnosis Date   Morbid obesity ([place])     Past Surgical History:  Procedure Laterality Date   CYST EXCISION     tailbone   WISDOM TOOTH EXTRACTION      Family History  Problem Relation Age of Onset   Huntington's disease Father     Social History   Socioeconomic History   Marital status: Single    Spouse name: Not on file   Number of children: Not on file   Years of education: Not on file   Highest education level: Not on file  Occupational History   Not on file  Tobacco Use   Smoking status: Former    Types: Cigarettes   Smokeless tobacco: Never  Vaping Use   Vaping Use: Never used  Substance and Sexual Activity   Alcohol use: Never   Drug use: Never   Sexual activity: Not on file  Other Topics Concern   Not on file  Social History Narrative   Not on file   Social Determinants of Health   Financial Resource Strain: Not on file  Food Insecurity: Not on file  Transportation Needs: Not on file  Physical Activity: Not on file  Stress: Not on file  Social Connections: Not on file  Intimate Partner Violence: Not on file    ROS Review of Systems  All other systems reviewed and are negative.  Objective:   Today's Vitals: BP 114/79   Pulse 79   Temp 98.2 F (36.8 C) (Oral)   Resp 16   Wt 199 lb 6.4 oz (90.4 kg)   SpO2 98%   BMI 34.23 kg/m   Physical Exam Vitals and nursing note reviewed.  Constitutional:      General: She is not in acute distress. HENT:     Head: Normocephalic and atraumatic.     Right Ear: Tympanic membrane, ear canal and external ear normal.     Left Ear: Tympanic membrane, ear canal and external  ear normal.     Nose: Nose normal.     Mouth/Throat:     Mouth: Mucous membranes are moist.     Pharynx: Oropharynx is clear.  Eyes:     Conjunctiva/sclera: Conjunctivae normal.     Pupils: Pupils are equal, round, and reactive to light.  Neck:     Thyroid: No thyromegaly.  Cardiovascular:     Rate and Rhythm: Normal rate and regular rhythm.     Heart sounds: Normal heart sounds. No murmur heard. Pulmonary:     Effort: Pulmonary effort is normal. No respiratory distress.     Breath sounds: Normal breath sounds.  Abdominal:     General: There is no distension.     Palpations: Abdomen is soft. There is no mass.     Tenderness: There is no abdominal tenderness.  Musculoskeletal:        General: Normal range of motion.     Cervical back: Normal range of motion and neck supple.  Skin:    General: Skin is warm and dry.  Neurological:     General: No focal deficit present.     Mental Status: She is alert  and oriented to person, place, and time.  Psychiatric:        Mood and Affect: Mood normal.        Behavior: Behavior normal.    Assessment & Plan:   1. Annual physical exam Unremarkable exam. Routine labs ordered - CMP14+EGFR - CBC with Differential - Lipid Panel    No outpatient encounter medications on file as of 08/01/2021.   No facility-administered encounter medications on file as of 08/01/2021.    Follow-up: Return in about 1 year (around 08/01/2022) for physical.   Becky Sax, MD

## 2021-10-17 ENCOUNTER — Other Ambulatory Visit: Payer: Self-pay

## 2021-10-17 ENCOUNTER — Ambulatory Visit: Payer: Medicaid Other | Admitting: Family Medicine

## 2022-04-03 ENCOUNTER — Ambulatory Visit: Payer: Commercial Managed Care - HMO | Admitting: Family Medicine
